# Patient Record
Sex: Male | Born: 1945 | Race: White | Hispanic: No | Marital: Married | Smoking: Never smoker
Health system: Southern US, Community
[De-identification: ages and names within clinical notes are randomized; demographics above are authoritative.]

## PROBLEM LIST (undated history)

## (undated) DIAGNOSIS — IMO0001 Reserved for inherently not codable concepts without codable children: Secondary | ICD-10-CM

## (undated) DIAGNOSIS — M545 Low back pain, unspecified: Secondary | ICD-10-CM

## (undated) DIAGNOSIS — T8859XA Other complications of anesthesia, initial encounter: Secondary | ICD-10-CM

## (undated) DIAGNOSIS — E669 Obesity, unspecified: Secondary | ICD-10-CM

## (undated) DIAGNOSIS — M1990 Unspecified osteoarthritis, unspecified site: Secondary | ICD-10-CM

## (undated) DIAGNOSIS — T4145XA Adverse effect of unspecified anesthetic, initial encounter: Secondary | ICD-10-CM

## (undated) HISTORY — PX: ROTATOR CUFF REPAIR: SHX139

## (undated) HISTORY — DX: Low back pain, unspecified: M54.50

## (undated) HISTORY — DX: Obesity, unspecified: E66.9

## (undated) HISTORY — DX: Low back pain: M54.5

## (undated) HISTORY — PX: HERNIA REPAIR: SHX51

---

## 2003-08-28 ENCOUNTER — Inpatient Hospital Stay (HOSPITAL_COMMUNITY): Admission: RE | Admit: 2003-08-28 | Discharge: 2003-08-30 | Payer: Self-pay | Admitting: General Surgery

## 2003-08-28 ENCOUNTER — Encounter (INDEPENDENT_AMBULATORY_CARE_PROVIDER_SITE_OTHER): Payer: Self-pay | Admitting: Specialist

## 2004-04-13 ENCOUNTER — Encounter: Admission: RE | Admit: 2004-04-13 | Discharge: 2004-04-13 | Payer: Self-pay | Admitting: General Surgery

## 2007-11-05 ENCOUNTER — Emergency Department (HOSPITAL_COMMUNITY): Admission: EM | Admit: 2007-11-05 | Discharge: 2007-11-06 | Payer: Self-pay | Admitting: Emergency Medicine

## 2008-02-01 ENCOUNTER — Encounter: Admission: RE | Admit: 2008-02-01 | Discharge: 2008-02-01 | Payer: Self-pay | Admitting: General Surgery

## 2008-04-28 ENCOUNTER — Encounter (INDEPENDENT_AMBULATORY_CARE_PROVIDER_SITE_OTHER): Payer: Self-pay | Admitting: General Surgery

## 2008-04-28 ENCOUNTER — Ambulatory Visit (HOSPITAL_COMMUNITY): Admission: RE | Admit: 2008-04-28 | Discharge: 2008-04-29 | Payer: Self-pay | Admitting: General Surgery

## 2008-06-13 ENCOUNTER — Encounter: Admission: RE | Admit: 2008-06-13 | Discharge: 2008-06-13 | Payer: Self-pay | Admitting: General Surgery

## 2008-06-25 ENCOUNTER — Ambulatory Visit (HOSPITAL_BASED_OUTPATIENT_CLINIC_OR_DEPARTMENT_OTHER): Admission: RE | Admit: 2008-06-25 | Discharge: 2008-06-26 | Payer: Self-pay | Admitting: General Surgery

## 2008-12-10 ENCOUNTER — Encounter: Admission: RE | Admit: 2008-12-10 | Discharge: 2008-12-10 | Payer: Self-pay | Admitting: Family Medicine

## 2009-10-07 IMAGING — CT CT ABDOMEN W/ CM
2 of 5 series · 16 of 46 positions shown, 18 images · IV contrast ([ID] OMNI 300)
Comparison: 02/01/2008

CT ABDOMEN

CLINICAL DATA: Hematoma.  Status post hernia repair.

CT ABDOMEN AND PELVIS WITH CONTRAST
TECHNIQUE: Multidetector CT imaging of the abdomen and pelvis was
performed using the standard protocol following bolus
administration of intravenous contrast.
Contrast: 125 ml of omni 300

[Series 3: routine abdomen · axial · 0.88mm/px · z∈[-425,-40]mm · 13 of 89 slices shown, 15 images]
[im 6/89  soft-tissue]
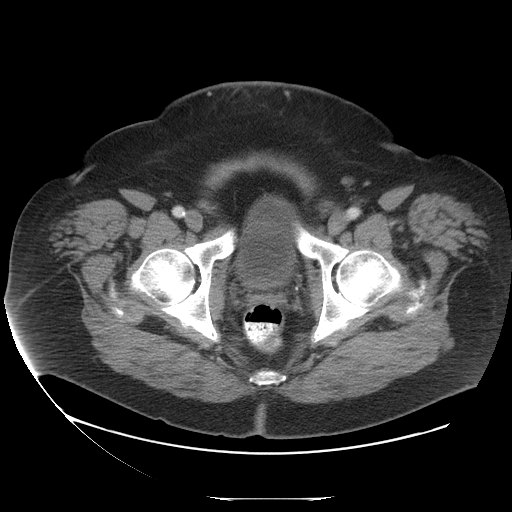
[im 6/89  bone]
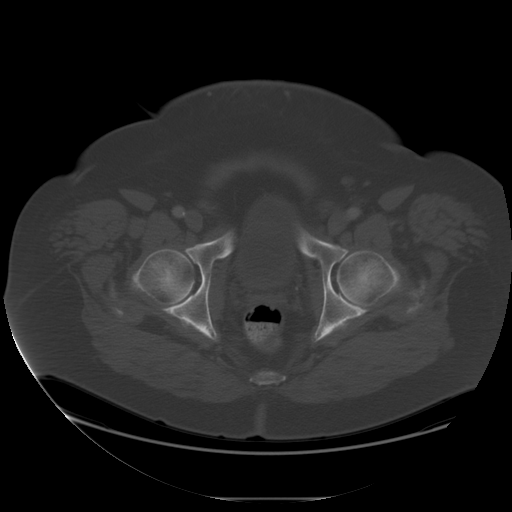
[im 11/89  soft-tissue]
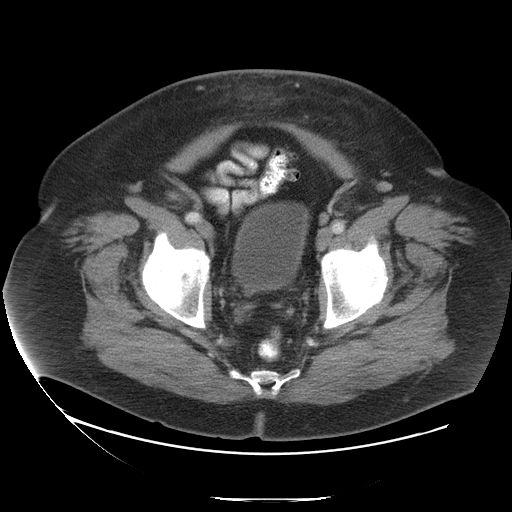
[im 21/89  soft-tissue]
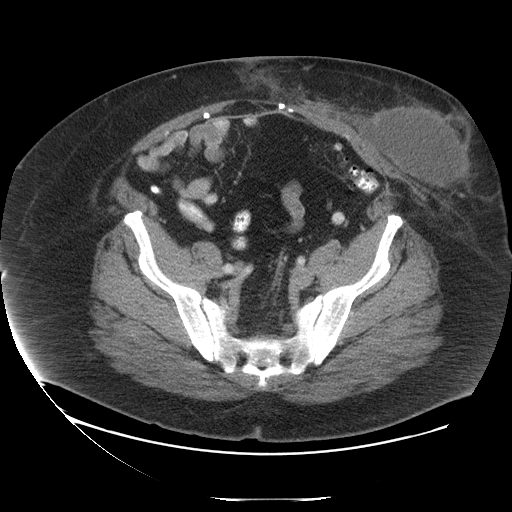
[im 26/89  soft-tissue]
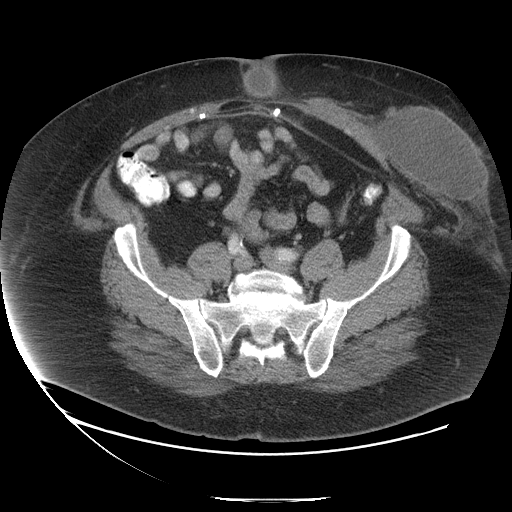
[im 32/89  soft-tissue]
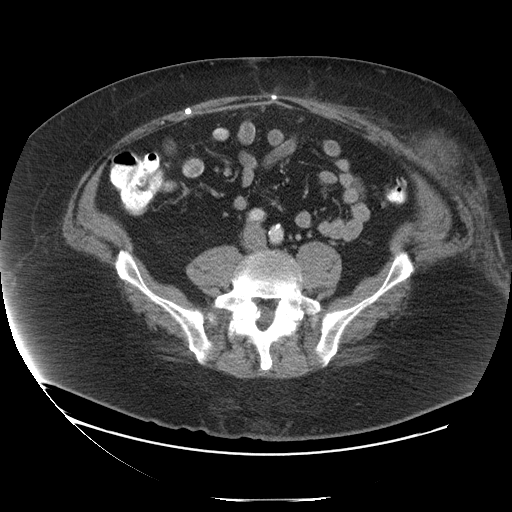
[im 37/89  soft-tissue]
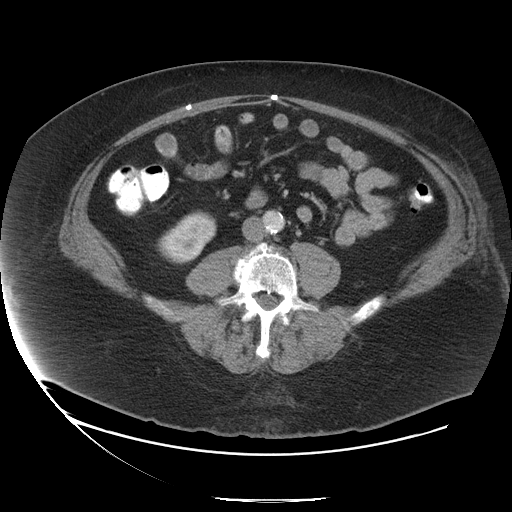
[im 47/89  soft-tissue]
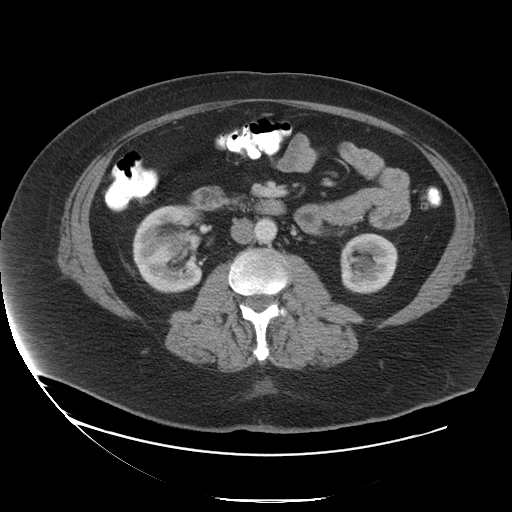
[im 52/89  soft-tissue]
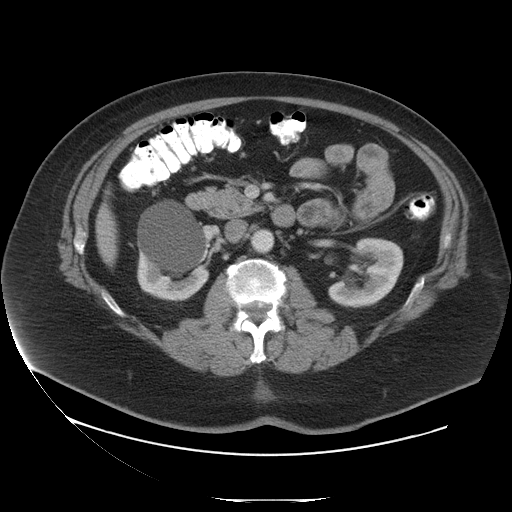
[im 57/89  soft-tissue]
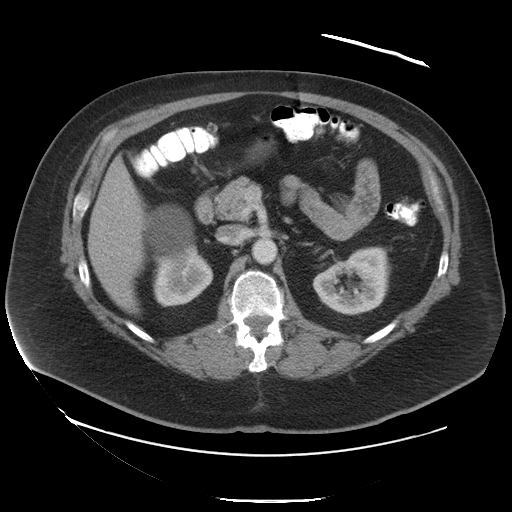
[im 57/89  bone]
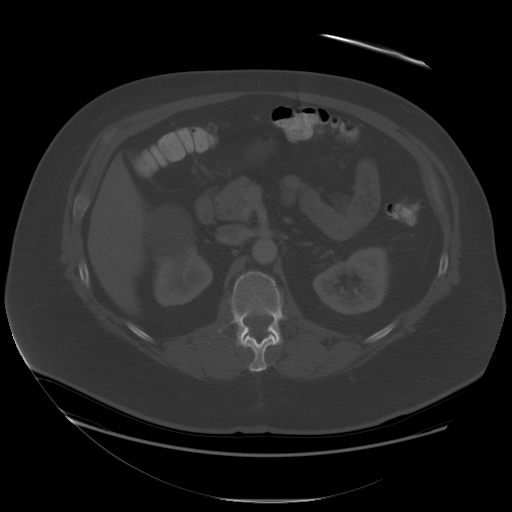
[im 63/89  soft-tissue]
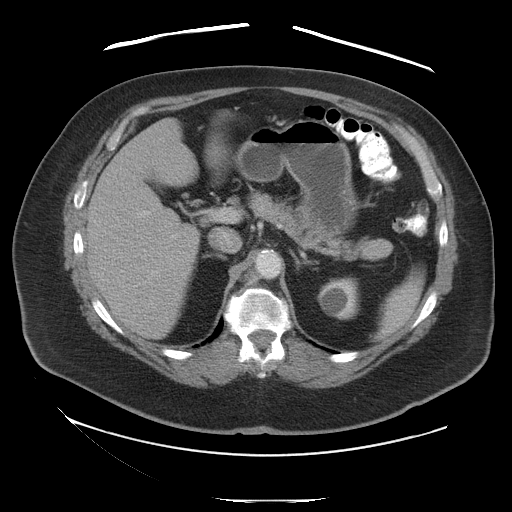
[im 68/89  soft-tissue]
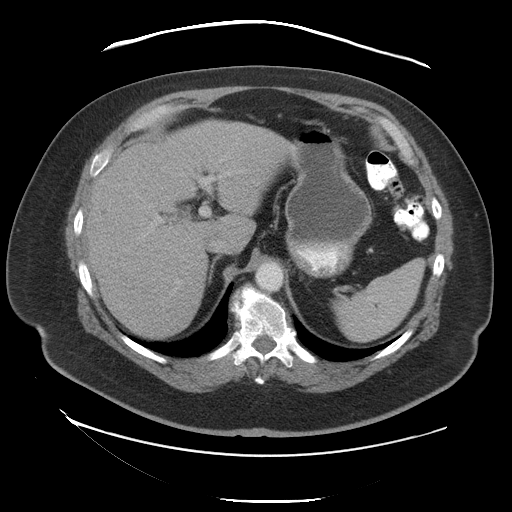
[im 78/89  soft-tissue]
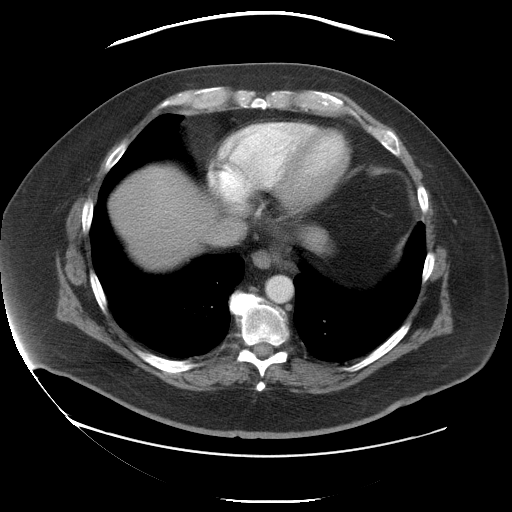
[im 83/89  soft-tissue]
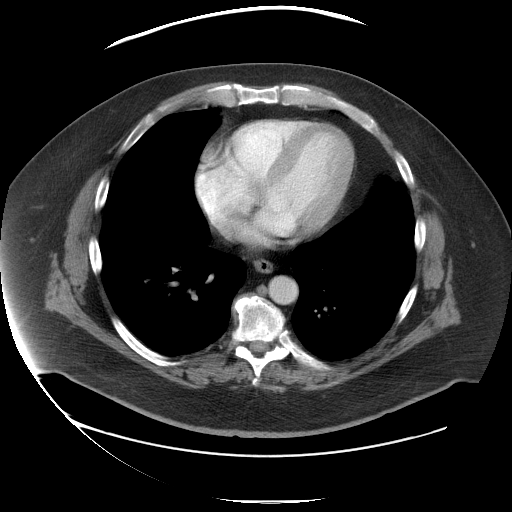

[Series 602: sagittal body · sagittal · 0.95mm/px · 3 of 181 slices shown]
[im 61/181  soft-tissue]
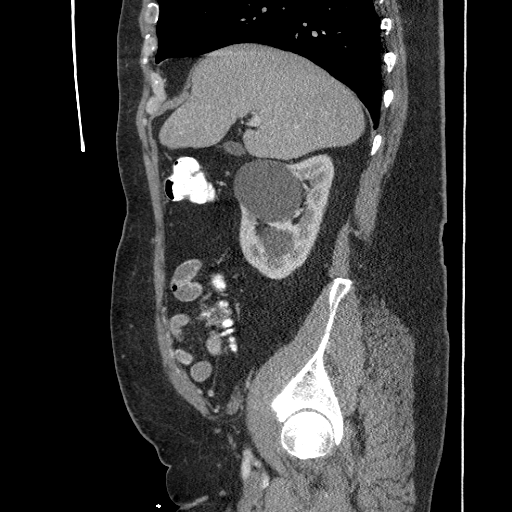
[im 81/181  soft-tissue]
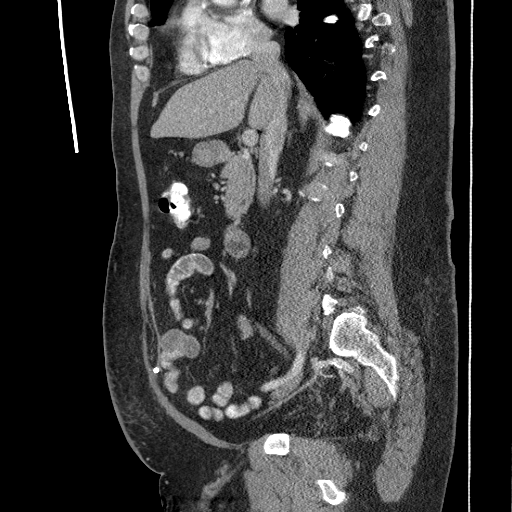
[im 101/181  soft-tissue]
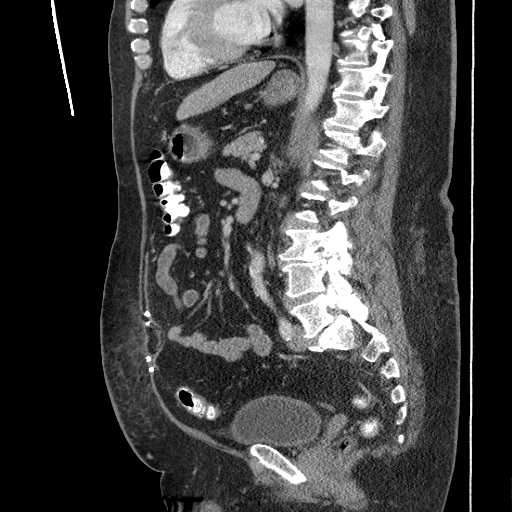

[16 of 46 positions shown; findings below may reference images not displayed]

FINDINGS: Stable 6 mm nodule in the right lung base, image #3

The spleen is normal.

The adrenal glands are normal.

Pancreas there is normal.

The liver parenchyma is normal in attenuation and morphology. There
are bilateral renal cysts which are unchanged from previous exam.

There are no enlarged retroperitoneal or small bowel mesenteric
lymph nodes.

The bowel loops of the upper abdomen are normal in their course and
caliber.

There is no free fluid.

Review of the visualized osseous structures shows lumbar
degenerative disc disease.

There are bilateral pars defects at the L5 level.

A first degree anterolisthesis of L5 on S1 is noted.  A first
degree anterolisthesis of L3 on L4 is also present.
IMPRESSION: 1.  There is no acute upper abdominal CT findings.
2.  Advanced lumbar spondylosis.
3.  Stable renal cysts.
4.  No change in right lung base pulmonary nodule.

CT PELVIS
FINDINGS: Fluid collection within the left lateral abdominal wall
measures 12.6 x 5.9 cm, image 67.  On the previous exam this
measured 4.3 x 3.7 cm.

A second fluid collection is identified in the region of umbilicus.
This measures 3 cm, image 62.

Hernia mesh is identified along the undersurface of the ventral
pelvic wall.

There are diverticular changes affecting the sigmoid colon.

There are no enlarged pelvic or inguinal lymph nodes.

No free fluid.

The urinary bladder is normal.

Review of visualized osseous structures is negative
IMPRESSION: 1.  Left ventral pelvic wall fluid collection has increased in size
from previous exam.  This may represent a chronic hematoma, abscess
or seroma.
2.  Periumbilical fluid collection is new from the previous exam.
Differential considerations include seroma, chronic hematoma or
abscess.

## 2010-04-05 IMAGING — US US ABDOMEN COMPLETE
1 series · 14 of 25 positions shown · non-contrast
Comparison: 06/13/2008

CLINICAL DATA: Abnormal liver function test

COMPLETE ABDOMINAL ULTRASOUND

[Series 1: us abdomen complete · 0.32mm/px · 14 of 78 slices shown]
[im 1/78]
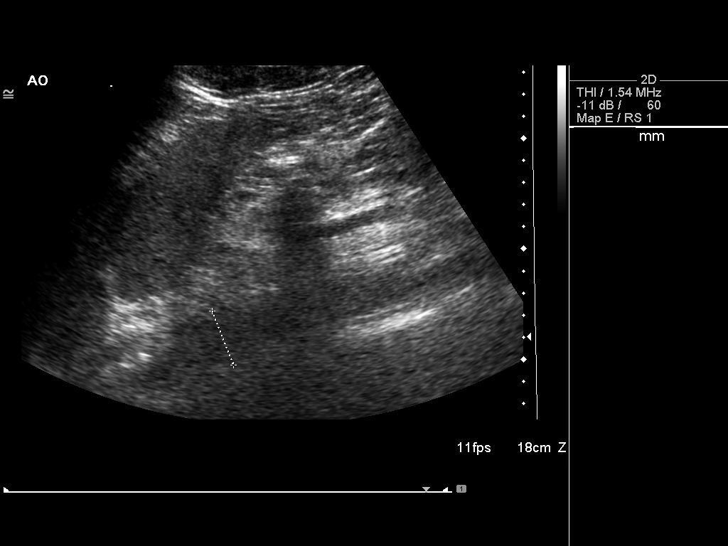
[im 7/78]
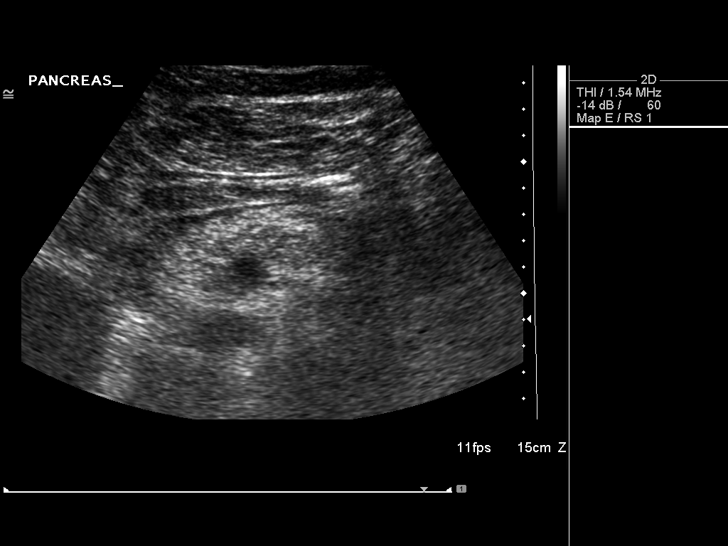
[im 13/78]
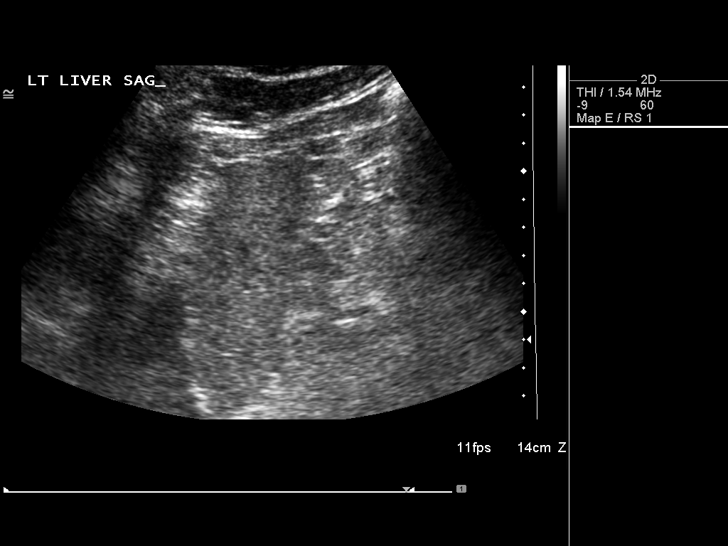
[im 20/78]
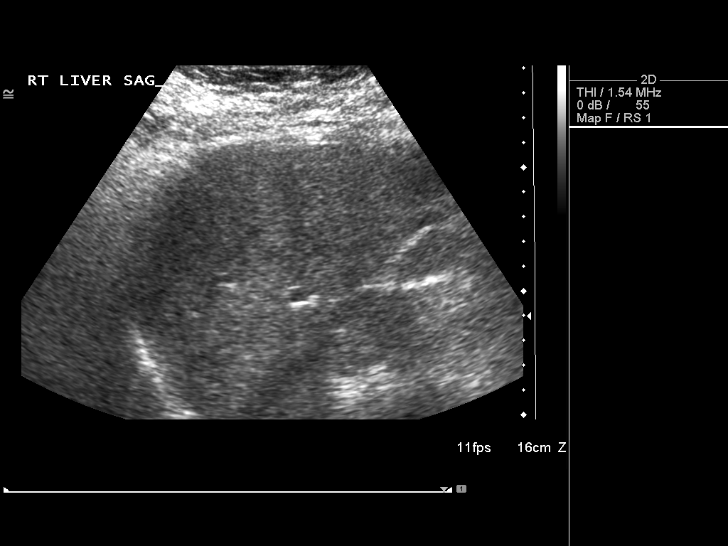
[im 26/78]
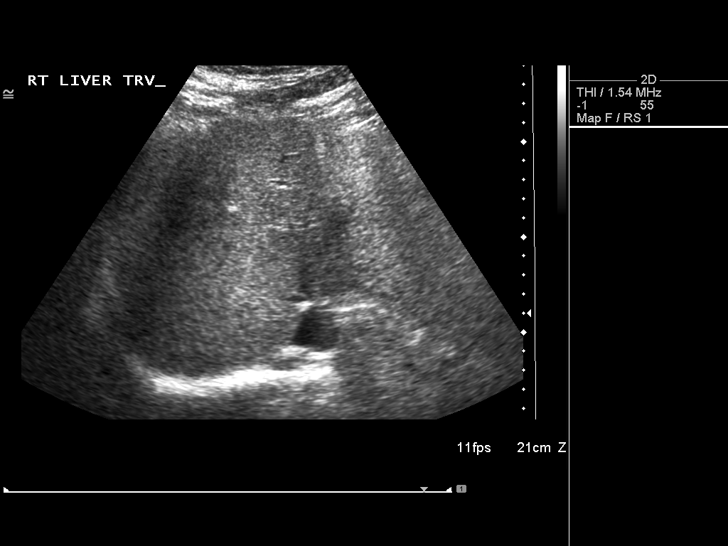
[im 29/78]
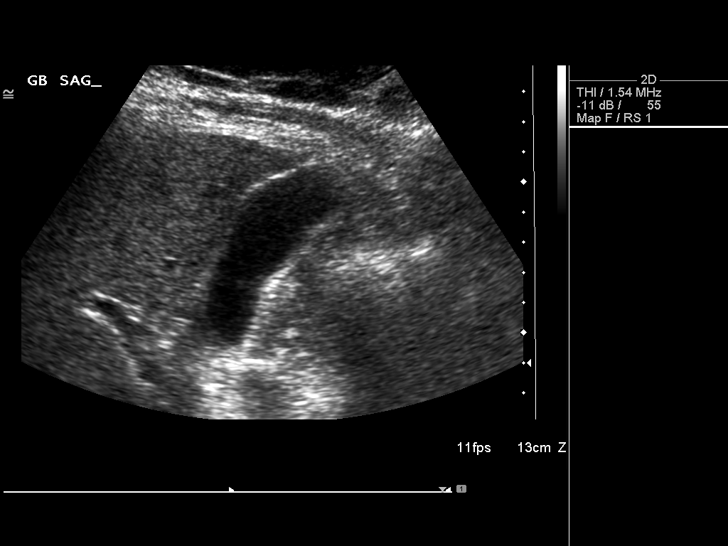
[im 36/78]
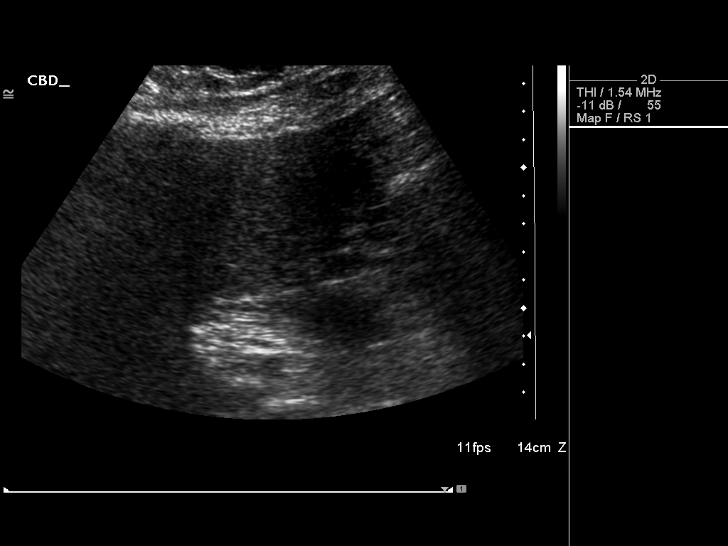
[im 42/78]
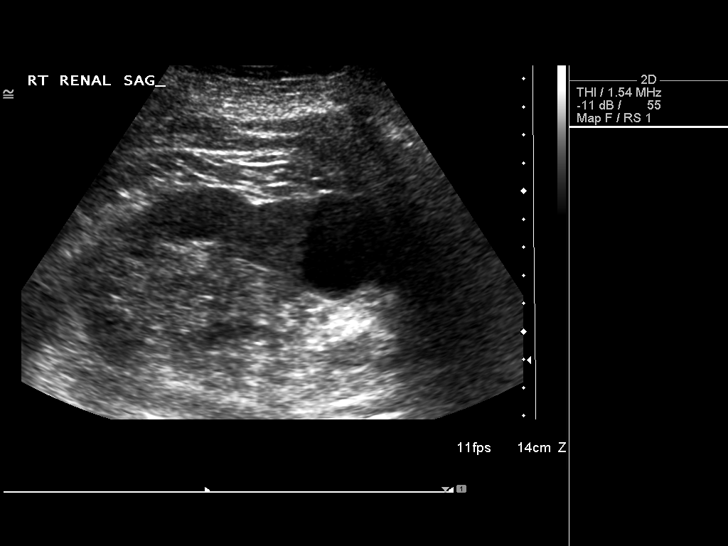
[im 49/78]
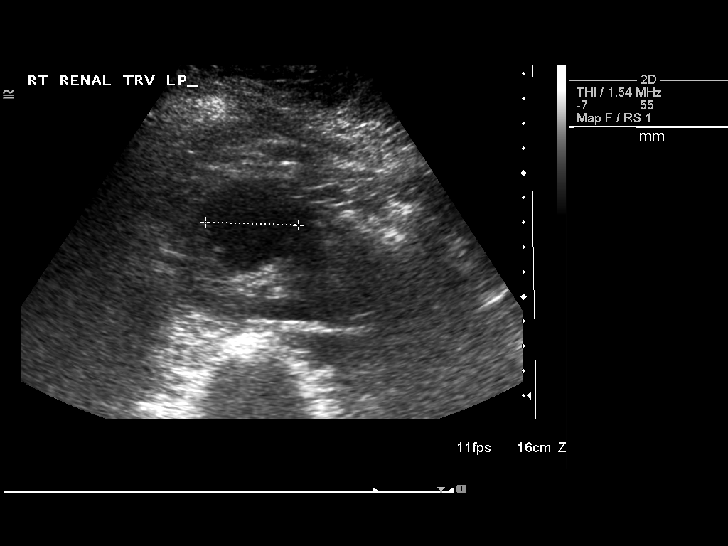
[im 52/78]
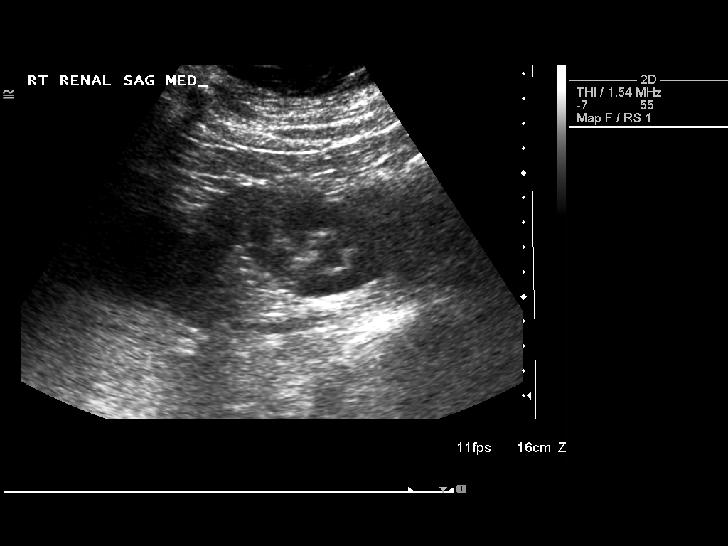
[im 58/78]
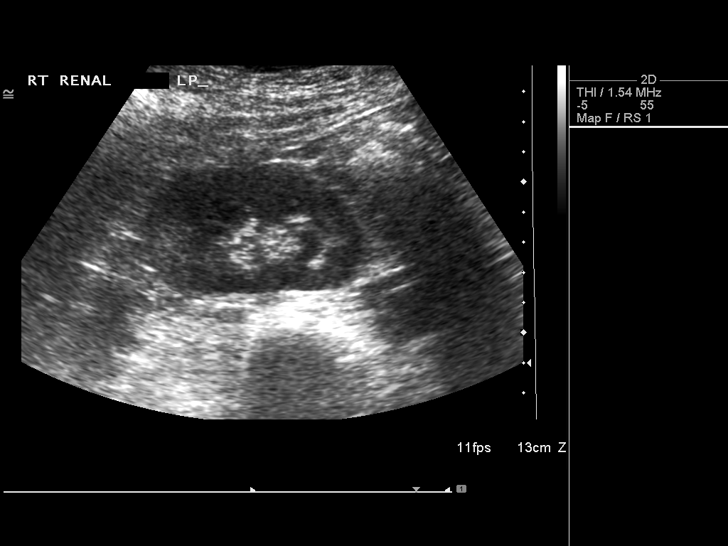
[im 65/78]
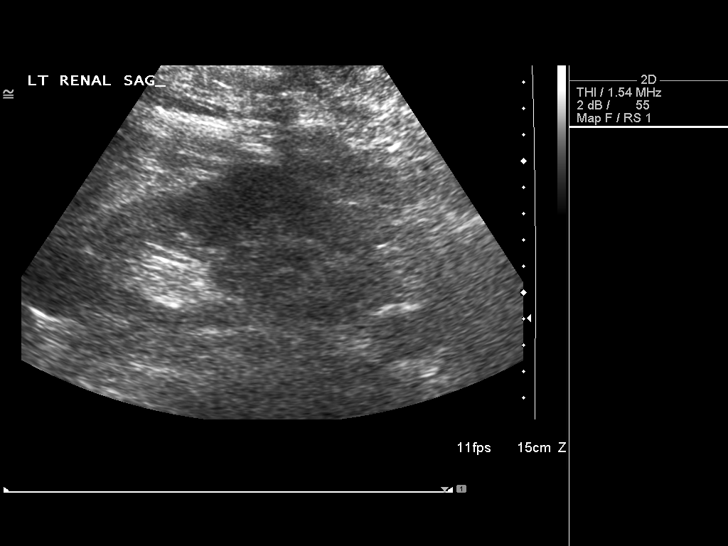
[im 71/78]
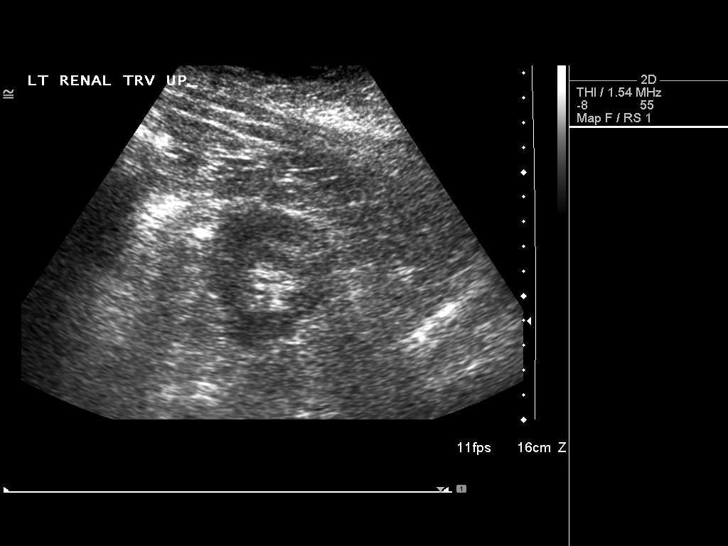
[im 78/78]
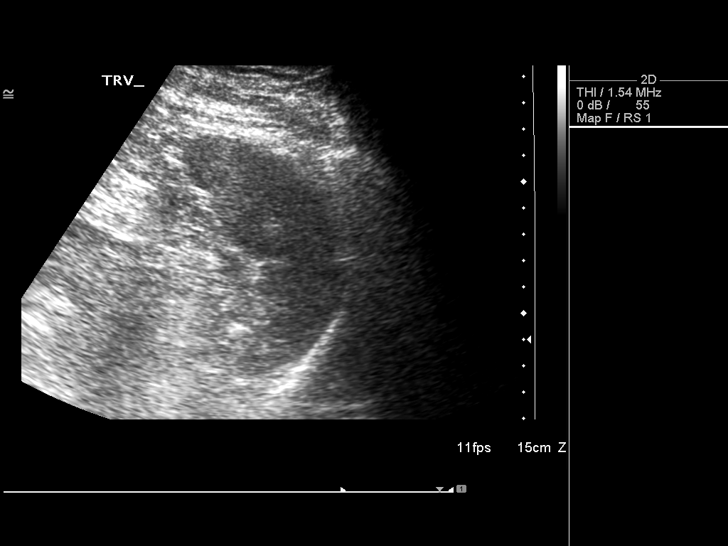

[14 of 25 positions shown; findings below may reference images not displayed]

FINDINGS: Gallbladder:  No gallstones, wall thickening, or pericholecystic
fluid.

Common bile duct:  Normal in caliber.

Liver:  Normal echogenicity.  No focal mass.

IVC:  Patent.

Pancreas:  Obscured by overlying bowel gas.  No obvious mass.

Spleen:  Unremarkable.

Right Kidney:  Two simple cysts are stable.  13.5 cm in length.  No
hydronephrosis or solid mass.

Left Kidney:  One simple cyst in the upper pole is stable.  Normal
echogenicity.  No hydronephrosis or solid mass.

Abdominal aorta:  Maximal caliber is 2.6 cm.
IMPRESSION: Stable simple cyst in the kidneys.  Limited visualization of the
pancreas.  No acute intra-abdominal pathology.

## 2010-08-31 LAB — URINE MICROSCOPIC-ADD ON

## 2010-08-31 LAB — BASIC METABOLIC PANEL
BUN: 13 mg/dL (ref 6–23)
CO2: 27 mEq/L (ref 19–32)
Calcium: 9.1 mg/dL (ref 8.4–10.5)
Chloride: 102 mEq/L (ref 96–112)
Creatinine, Ser: 0.82 mg/dL (ref 0.4–1.5)
GFR calc Af Amer: >60 mL/min (ref >60)
GFR calc non Af Amer: >60 mL/min (ref >60)
Glucose, Bld: 130 mg/dL — ABNORMAL HIGH (ref 70–99)
Potassium: 4.1 mEq/L (ref 3.5–5.1)
Sodium: 135 mEq/L (ref 135–145)

## 2010-08-31 LAB — URINALYSIS, ROUTINE W REFLEX MICROSCOPIC
Bilirubin Urine: NEGATIVE
Glucose, UA: NEGATIVE mg/dL
Hgb urine dipstick: NEGATIVE
Ketones, ur: NEGATIVE mg/dL
Nitrite: NEGATIVE
Protein, ur: NEGATIVE mg/dL
Specific Gravity, Urine: 1.017 (ref 1.005–1.030)
Urobilinogen, UA: 0.2 mg/dL (ref 0.0–1.0)
pH: 6 (ref 5.0–8.0)

## 2010-08-31 LAB — PROTIME-INR
INR: 1 (ref 0.00–1.49)
Prothrombin Time: 13 seconds (ref 11.6–15.2)

## 2010-08-31 LAB — CBC
HCT: 46 % (ref 39.0–52.0)
Hemoglobin: 15.8 g/dL (ref 13.0–17.0)
MCHC: 34.4 g/dL (ref 30.0–36.0)
MCV: 102.6 fL — ABNORMAL HIGH (ref 78.0–100.0)
Platelets: 264 10*3/uL (ref 150–400)
RBC: 4.48 MIL/uL (ref 4.22–5.81)
RDW: 12.7 % (ref 11.5–15.5)
WBC: 6 10*3/uL (ref 4.0–10.5)

## 2010-08-31 LAB — HIV RAPID SCREEN (BLD OR BODY FLD EXPOSURE)

## 2010-08-31 LAB — DIFFERENTIAL
Basophils Absolute: 0 10*3/uL (ref 0.0–0.1)
Basophils Relative: 0 % (ref 0–1)
Eosinophils Absolute: 0.1 10*3/uL (ref 0.0–0.7)
Eosinophils Relative: 2 % (ref 0–5)
Lymphocytes Relative: 35 % (ref 12–46)
Lymphs Abs: 2.1 10*3/uL (ref 0.7–4.0)
Monocytes Absolute: 0.5 10*3/uL (ref 0.1–1.0)
Monocytes Relative: 8 % (ref 3–12)
Neutro Abs: 3.3 10*3/uL (ref 1.7–7.7)
Neutrophils Relative %: 55 % (ref 43–77)

## 2010-08-31 LAB — ANAEROBIC CULTURE

## 2010-08-31 LAB — WOUND CULTURE
Culture: NO GROWTH
Gram Stain: NONE SEEN

## 2010-08-31 LAB — APTT: aPTT: 27 seconds (ref 24–37)

## 2010-08-31 LAB — HEPATITIS B SURFACE ANTIGEN: Hepatitis B Surface Ag: NEGATIVE

## 2010-08-31 LAB — HEPATITIS C ANTIBODY, REFLEX: HCV Ab: NEGATIVE

## 2010-09-28 NOTE — Op Note (Signed)
NAMEDIONICIO, SHELNUTT NO.:  [id number]   MEDICAL RECORD NO.:  [id number]          PATIENT TYPE:  OIB   LOCATION:  1533                         FACILITY:  [facility]   PHYSICIAN:  Angelia Mould. Derrell Lolling, M.D.DATE OF BIRTH:  06-Mar-1946   DATE OF PROCEDURE:  04/28/2008  DATE OF DISCHARGE:  04/29/2008                               OPERATIVE REPORT   PREOPERATIVE DIAGNOSES:  1. Ventral hernia.  2. 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   POSTOPERATIVE DIAGNOSES:  1. Ventral hernia.  2. 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   OPERATION PERFORMED:  1. Laparoscopic repair of ventral hernia, with 15 cm x 10 cm Parietex      composite mesh.  2. Excision of a 4.5 cm soft tissue mass of the abdominal wall, left      lower quadrant.   SURGEON:  Angelia Mould. Derrell Lolling, M.D.   OPERATIVE INDICATIONS:  This is a 65 year old [place] man who underwent  repair of a left spigelian hernia in 2005.  That surgery was uneventful.  He has noticed a lump in the incision.  This has been evaluated and is  thought to be a chronic hematoma.  The hernia repair is intact.  He did  not want to have that excised previously.   He recently returned to see me, having lost 40 pounds, and has a hernia  at his umbilicus.  He wanted to have that repaired as well as the mass  excised.   On exam, he has a moderately large hernia in the periumbilical region,  with a defect about 3 cm and a hernia sac about 4 cm in size.  In the  left lower quadrant underlying the left lower quadrant incision from his  hernia repair, there is a very firm, nontender palpable mass that is not  fixed to the fascia; and by CT is in the subcutaneous tissue.  He is  brought to the operating room for surgical management of both problems.   OPERATIVE TECHNIQUE:  Following the induction of general endotracheal  anesthesia, the patient's abdomen and genitalia and lower chest wall  were prepped  and draped in a sterile fashion.  The patient was  identified as the correct patient, correct procedures and correct site.  A time-out was held.  Intravenous antibiotics were given.  Marcaine 0.5%  with epinephrine was used as a local infiltration anesthetic.   We repaired the ventral hernia first.  A 5-mm OptiVu port was placed in  the left subcostal region, and that insertion was atraumatic.  Pneumoperitoneum was created.  A 5 mm camera was inserted.  We carefully  looked around; saw no evidence of any injury.  There was omentum  incarcerated in the ventral hernia but I was able to push this back into  the abdominal cavity; I did not have to take down any adhesions.  The  defect looked to be about 3 cm in diameter, maybe 4 cm at most.  The  liver and omentum otherwise looked normal.  In the left lower quadrant  there were a  few adhesions, presumably related to the previous surgery.  However there was no evidence of any hernia down there.   I placed a 5-mm trocar in the left lower quadrant and a 11-mm trocar in  the left midabdomen.   I examined the hernia defect.  Using a spinal needle, I measured and  marked the circumference of the defect, and it actually turned out to be  about 4 cm vertically x about 3.5 cm transversely.  To cover this well I  brought a 10 cm x 15 cm rectangular piece of Parietex composite sealing  mesh into the operative field.  This was placed on the abdominal wall  and a template was marked for 8 equidistant suture fixation sites.  I  then used 0 Novofil and placed 8 interrupted sutures in the mesh at the  edge of the mesh, and tied 3 or 4 knots and leaving the ends long.  I  was very careful to place this on the rough side, that would be up  toward the abdominal wall -- leaving the smooth side toward the viscera.   I then moistened the mesh, rolled it up and inserted it through the 11-  mm trocar.  Under laparoscopic guidance we then positioned this.  I  made  8 small puncture wounds in the abdominal wall, through the previously  marked template.  I then drew the sutures up through the puncture  wounds, being careful to take about a  1-cm bite of fascia.  After all 8 sutures were placed, we lifted the  mesh up and we had excellent coverage of the defect.  No redundancy.  Placement of the sutures.  All 8 sutures were then tied and cut.  We  then further secured the mesh to the abdominal wall with about 40  5-mm  titanium screw tacks.  I placed these around the perimeter of the mesh,  no more widely than 1 cm apart.  I then fused centrally.  This was  observed and looked fine.  A survey of the abdomen and pelvis saw no  other any problems or injury.  There was no bleeding anywhere.  I  released the pneumoperitoneum.  The trocars were removed.  All of these  trocar sites were closed with skin staples.   I then turned my attention to the mass in the left lower quadrant.  I  made about a 8 cm incision overlying the palpable mass.  Dissection was  carried down through the subcutaneous tissue.  I then dissected around  the mass and found that it was completely free of the fascia.  It felt  firm and looked like a chronic hematoma.  I completely excised this and  sent it to the Lab for routine histology.  I examined the abdominal wall  and the hernia repair was solid and secure.  The wound was irrigated  with saline.  Hemostasis was excellent and treated with electrocautery.  Subcutaneous tissue was closed with interrupted sutures of 2-0 Vicryl.  The skin was closed with skin staples.  Clean bandages were placed and  the patient taken to the recovery room in stable condition.   ESTIMATED BLOOD LOSS:  Was about 20 mL.   COMPLICATIONS:  None sponge, needle and counts were correct.      Angelia Mould. Derrell Lolling, M.D.  Electronically Signed     HMI/MEDQ  D:  04/28/2008  T:  04/29/2008  Job:  [id number]   cc:   C. Duane Lope, M.D.  Fax: [phone]

## 2010-09-28 NOTE — Op Note (Signed)
NAMELAVANCE, BEAZER                ACCOUNT NO.:  [id number]   MEDICAL RECORD NO.:  [id number]          PATIENT TYPE:  AMB   LOCATION:  DSC                          FACILITY:  [facility]   PHYSICIAN:  Angelia Mould. Derrell Lolling, M.D.DATE OF BIRTH:  1946/01/20   DATE OF PROCEDURE:  06/25/2008  DATE OF DISCHARGE:                               OPERATIVE REPORT   PREOPERATIVE DIAGNOSIS:  Recurrent abdominal wall hematoma.   POSTOPERATIVE DIAGNOSIS:  Recurrent abdominal wall hematoma.   OPERATION PERFORMED:  Exploration of abdominal wound, evacuation of  hematoma.   SURGEON:  Angelia Mould. Derrell Lolling, MD   OPERATIVE INDICATIONS:  This is a 65 year old [place] man who underwent  repair of an incarcerated left spigelian hernia in 2005 with uneventful  recovery.  He returned 18 months later because of a lump in his incision  and on exam and by CT scan this showed a hematoma or seroma, but he  declined to have this evacuated or removed as it was asymptomatic.  He  returned to see me in September 2009 with symptomatic umbilical hernia  and wanted to have the umbilical hernia repaired and the hematoma  evacuated.  He was taken to the operating room on April 28, 2008 and  underwent laparoscopic repair of the periumbilical ventral hernia with  mesh and I evacuated the chronic hematoma from the left lower quadrant  incision.  He did well for a week or two but then developed swelling and  pain and ecchymoses and on followup in the office it was apparent that  he had some acute bleeding and a hematoma in the space where we had  evacuated the hematoma previously.  The ecchymoses have resolved.  He  wants to have this hematoma resolved and I think that it would be in his  best interest to do that.  He is brought to the operating room  electively.  It should be noted that there is no signs or evidence of  infection at this time.   OPERATIVE TECHNIQUE:  Following the induction of general endotracheal  anesthesia, the  patient's abdomen, groin, thigh and genitalia were  prepped and draped in a sterile fashion.  The patient was identified as  the correct patient, correct procedure, and correct site.  Intravenous  antibiotics were given.  I made an oblique incision in the left lower  quadrant of the abdominal wall, through the previous scar.  Dissection  was carried down through the subcutaneous tissue.  I carefully entered  the hematoma cavity and found that it was soft organizing hematoma.  There was no odor.  Aerobic and anaerobic cultures were taken.  I opened  the wound up for a distance of about 10 cm or so and evacuated the  hematoma.  I then used a pulsatile irrigator to lavage and irrigate the  entire wound and get all the debris out.  The walls of the wound looked  clean and without any sign of infection.  The spigelian hernia repair  was intact.  There was no active bleeding anywhere.  A 19-French Blake  drain was placed in the  wound and brought out through a separate stab  incision superolateral to the wound.  The drain was sutured in place  with a nylon suture and connected to a suction bulb.  The wound was  inspected one more time and there was no bleeding.  The  deep subcutaneous tissue was closed with interrupted sutures of 3-0  Vicryl, the skin closed with skin staples.  Clean bandages were placed  and the patient taken to recovery room in stable condition.  Estimated  blood loss was about 10 mL of acute blood.  The chronic hematoma itself  was probably 500 mL or so.      Angelia Mould. Derrell Lolling, M.D.  Electronically Signed     HMI/MEDQ  D:  06/25/2008  T:  06/26/2008  Job:  [id number]

## 2010-10-01 NOTE — Op Note (Signed)
Austin Estrada, Austin Estrada NO.:  [id number]   MEDICAL RECORD NO.:  [id number]                   PATIENT TYPE:  AMB   LOCATION:  DAY                                  FACILITY:  [facility]   PHYSICIAN:  Madlyn Frankel. Charlann Boxer, M.D.               DATE OF BIRTH:  April 10, 1946   DATE OF PROCEDURE:  08/28/2003  DATE OF DISCHARGE:                                 OPERATIVE REPORT   PREOPERATIVE DIAGNOSES:  1. Left shoulder biceps tendonitis.  2. Subacromial impingement.  3. Rotator cuff tear.   </POSTOPERATIVE DIAGNOSES/FINDINGS>  1. Degenerative labrum in the superior-anterior aspect at the biceps     attachment with significant biceps tendonitis and partial tearing of at     least a third of the tendon.  2. Subacromial impingement.  3. Supraspinatus rotator cuff tear.   PROCEDURE:  1. Left shoulder diagnostic and operative arthroscopy.  2. Labral debridement and biceps tenotomy.  3. Open subacromial decompression.  4. Open rotator cuff repair with a 6.5 mm Bioscrew with two sets of #2     Fibrewire medially and lateral repair of three bone tunnels with     Fibrewire.   SURGEON:  Durene Romans, MD   ASSISTANT:  Clarene Reamer, P.A.-C.   ANESTHESIA:  Regional plus general endotracheal anesthesia.   BLOOD LOSS:  100.   COMPLICATIONS:  None.   DRAINS:  None.   INDICATION FOR PROCEDURE:  Austin Estrada is a pleasant 65 year old, right-hand-  dominant male, who presented to the clinic with left shoulder pain and  dysfunction.  He was initially seen in [facility] and  referred for evaluation.  He states that he was cleaning his garage when he  was reaching onto a top shelf and felt a pop in his left shoulder.  He had  the immediate onset of pain without radicular symptoms.  He had had an MRI  that was performed by primary care physician which revealed rotator cuff  tear, and he was sent for evaluation.  Of note, the patient also was noted  to have on  preoperative examination, what was felt to be a lipoma on CT  scan.  This patient was seen by Dr. Derrell Lolling for surgical consideration of  this.  This rotator cuff repair was set up in conjunction with the lipoma  excision.  After reviewing risks and benefits of diagnostic and operative  arthroscopy as well as open rotator cuff repair, the patient consents for  the above procedure.  We reviewed the postoperative course and the lengthy  course expected.   PROCEDURE IN DETAIL:  Following the abdominal wall procedure, orthopedics  was called to the room.  The patient was positioned into the beach chair  position.  The left upper extremity was then prepped and draped in a sterile  fashion.  Note that the patient had already had preoperative antibiotics.  Landmarks were  identified.  Initial procedure began with diagnostic and  operative arthroscopy which revealed above findings.  The superior and  anterior labrum was significantly degenerative but appeared to be stable on  the superior aspect.  There was also significant degeneration and partial  tearing of the biceps tendon.  An anterior portal was created, and  debridement of the labrum was carried out.  A __________ incision was made  for biceps tenotomy secondary to this degenerative nature which was also  noted in his MRI.  Following this, a diagnostic evaluation of the rotator  cuff was carried out which revealed the anterior supraspinatus tear with  remaining being intact along the barrier of the humerus.  Following this  diagnostic evaluation, attention was directed to open repair.  The two  arthroscopic portals were reapproximated using a 3-0 nylon.  A lateral-based  incision was made.  Sharp dissection was carried down and skin flaps  created.  The deltoid muscle was then incised along one of its raphe over  the anterolateral aspect of the acromion.  Deltoid fascia was exposed off of  the acromion subperiosteally.  Following this, the  subacromial impingement  and acromial morphology was noted and identified to be tight.  A TPS  oscillating saw was used to perform an anterior open subacromial  decompression.  At this point, following decompression, the access to the  rotator cuff muscle tendon was a lot more accessible.  Subacromial bursa was  excised, allowing for further exposure.  A tear was identified, and it was  noted arthroscopically.  The tendon was noted to have some degenerative  change along the leading edge, and this was debrided out using a knife 15  blade.  At this point, attention was directed to preparation of the tear for  repair.  The greater tuberosity insertion was debrided of soft tissue, no  bone trough created.  Identification of the location for the bone screw was  identified and prepared and the 6.5 bone screw placed in a 45-degree angle.  Note that two retaining sutures were placed to allow for reduction of the  cuff tear.  With the cuff reduced, these sutures were passed medially.  Following this, two bone tunnels were created, and two modified Mason-Allen  sutures with #2 Fibrewire were passed.  With the tendon reduced to its  anatomic position, the medial sutures of the suture anchor were sutured  down.  Following this, the lateral bone tunnels were sutured down.  The  patient was noted to have a small gap anterior and a redundancy of the  tendon posterior to this repair.  The redundancy was excised and  reapproximated to itself.  The opening just anterior to the repair was  reapproximated using a #1 Vicryl through bone.  Following this, the wound  was copiously irrigated with normal saline solution.  The deltoid fascia was  reapproximated to itself over the top of the lateral acromion and then  reapproximated to itself laterally.  Subcutaneous layer was reapproximated  using 2-0 Vicryl and 4-0 chromic used on the skin.  The patient tolerated the procedure without complications.  The wound was  dressed sterilely with  Steri-Strips, dressing, sponges, and tape.                                               Madlyn Frankel Charlann Boxer, M.D.  MDO/MEDQ  D:  08/28/2003  T:  08/28/2003  Job:  [id number]

## 2010-10-01 NOTE — Discharge Summary (Signed)
NAMEEDWAR, COE NO.:  [id number]   MEDICAL RECORD NO.:  [id number]                   PATIENT TYPE:  INP   LOCATION:  0445                                 FACILITY:  [facility]   PHYSICIAN:  Angelia Mould. Derrell Lolling, M.D.             DATE OF BIRTH:  August 04, 1945   DATE OF ADMISSION:  08/28/2003  DATE OF DISCHARGE:  08/30/2003                                 DISCHARGE SUMMARY   FINAL DIAGNOSES:  1. Incarcerated left spigelian hernia.  2. Left shoulder biceps tendinitis with partial tear and subacromial     impingement and rotator cuff tear.  3. Obesity.   OPERATIONS PERFORMED:  1. Repair of incarcerated left spigelian hernia with polypropylene mesh.  2. Left shoulder arthroscopy, labral debridement, and biceps tenotomy, open     subacromial decompression, open rotator cuff repair, with 6.5 mm     BioScrew, and with 2 sets of #2 fiber wire medially, and lateral repair     of 3 bone tunnels with fiber wire.  Date of surgery August 28, 2003.   HISTORY:  This is a 65 year old [place] man who has had a lump in his left  lower quadrant of his abdominal wall for some time.  This has been  enlarging.  It is not clear whether this was every reducible.  He had a CT  scan in [place], [place], which showed a giant lipoma in the left  abdominal wall.  I took a look at this, and indeed it looks like all adipose  tissue.  This is completely lateral to the rectus muscle that is between the  internal and external oblique muscles.  He also has a left rotator cuff  tear, and surgery is planned by Dr. Durene Romans.  On examination of the  abdomen, he is obese, weighing 272 pounds, and being 5'7 tall.  In his left  lower quadrant, there is a 30-40 cm elliptical-shaped soft tissue mass,  which is really not tender and not reducible.  This is above the inguinal  ligament.  Surgery was planned, and the patient was brought to the operating  room electively.   HOSPITAL COURSE:   The patient was admitted to the hospital and taken to the  operating room on the day of admission.  The first procedure was performed  by me.  I explored the abdominal wall mass through an oblique incision, and  I found that he had a large incarcerated left spigelian hernia containing  the sigmoid colon.  I debrided the hernia sac, inspected the sigmoid colon,  reduced the colon, closed the defect primarily, and then reinforced the  repair with a large sheet of polypropylene mesh, and closed the wound.  Dr.  Durene Romans then came to the operating room and performed the left shoulder  surgery as described above, and that went well.   Postoperatively, the patient did well.  He progressed  slowly but steadily in  his diet and activities.  A Foley catheter was removed on postoperative day  #1, and his diet and activities were advanced.  On August 30, 2003, he was  tolerating a regular diet and ambulatory.  Jackson-Pratt drain was draining  thin fluid, but drained 65 cc, and so was left in place.  He was discharged  on August 30, 2003.   DISCHARGE MEDICATIONS:  1. He was given a prescription for Vicodin for pain.  2. He was told to continue his usual medications.   FOLLOW UP:  1. He was to follow up with Dr. Derrell Lolling in 3-5 days to check the wound and     the drain.  2. Dr. Charlann Boxer plans to see the patient in 2 weeks.   DISCHARGE INSTRUCTIONS:  He was given instructions in diet and activities.                                               Angelia Mould. Derrell Lolling, M.D.    HMI/MEDQ  D:  09/08/2003  T:  09/08/2003  Job:  [id number]   cc:   Beverely Low, M.D.  [place], [facility] D. Charlann Boxer, M.D.  [facility]  [address]  [address]  [place]  [place] [address]  Fax: [phone]

## 2010-10-01 NOTE — Op Note (Signed)
Austin Estrada, Austin Estrada NO.:  [id number]   MEDICAL RECORD NO.:  [id number]                   PATIENT TYPE:  AMB   LOCATION:  DAY                                  FACILITY:  [facility]   PHYSICIAN:  Angelia Mould. Derrell Lolling, M.D.             DATE OF BIRTH:  1946-04-15   DATE OF PROCEDURE:  08/28/2003  DATE OF DISCHARGE:                                 OPERATIVE REPORT   PREOPERATIVE DIAGNOSIS:  Abdominal wall mass.   POSTOPERATIVE DIAGNOSIS:  Incarcerated left spigelian hernia.   OPERATION PERFORMED:  Repair of incarcerated left spigelian hernia with  mesh.   SURGEON:  Angelia Mould. Derrell Lolling, M.D.   OPERATIVE INDICATIONS:  This is a 65 year old [place] man who has had a knot  in the left lower quadrant of his abdominal wall for some time.  This was  originally thought to be a hernia and was enlarging, not very painful, it  bothers him mostly because of its prominence.  He really states that he does  not have any GI symptoms.  He had a CT scan of the abdomen and pelvis in  [place], [place] which was read out as showing a giant lipoma of the  abdominal wall.  This was felt to be deep to the external oblique muscle.  The only thing seen within this was fat density, no GI contents were seen,  there was no bile obstruction or other lesions noted.  On exam he does have  a moderately large palpable mass about the size of a grapefruit in the left  lower quadrant of the abdominal wall that is not tender.  He also has a left  rotator cuff injury and needs to have that repaired.  Dr. Orpah Clinton and I  have discussed his care and have decided to perform exploration of the  abdominal wall and the rotator cuff at the same setting.   OPERATIVE TECHNIQUE:  Following the induction of general endotracheal  anesthesia, a Foley catheter was inserted.  Intravenous antibiotics were  given.  The abdomen was prepped and draped in a sterile fashion.  An oblique  incision was made in the  left lower quadrant.  Dissection was carried down  through the subcutaneous tissue.  The external oblique was visualized and  incised obliquely, entering the space between the internal oblique and the  external oblique.  I encountered a large hernia sac.  I dissected the  external oblique away from the internal oblique generously in all  directions, all the way medially to the rectus sheath.  I then opened the  hernia sac and found a large loop of sigmoid colon which did not appear  diseased in any way.  I took a few adhesions down, opened the incision a  little bit and reduced the sigmoid colon back into the abdominal cavity.  There was no bleeding.  I closed the muscle layers of the  internal oblique  and transversus abdominis with interrupted sutures of #1 Novofil.  This was  done very carefully to avoid any injury to the intra-abdominal contents.  The wound was copiously irrigated with saline.  I chose to reinforce the  hernia repair with a 6 inch x 6 inch sheet of polypropylene mesh.  This had  to be cut down to size a bit.  This was sutured on top of the internal  oblique with interrupted mattress sutures of 0 Prolene.  Medially, I took  the coverage over on top of the anterior rectus sheath a little bit.  The  wound was then irrigated with saline.  I placed a 50 [place] Blake drain in  the space between the internal oblique and the external oblique and brought  that out through the abdominal wall laterally, sutured that to the skin and  connected it to a suction bulb.  The external oblique was then closed over  the mesh with a running suture of #1 PDS.  This coverage was good.  The  wound was irrigated with saline.  The subcutaneous tissue was closed with  interrupted sutures of 2-0 Vicryl and the  skin closed with skin staples.  Skin bandages were placed and the patient  taken to the recovery room in stable condition.  Estimated blood loss was  about 75-100 cc.  There were no  complications.  Sponge, needle and  instrument counts were correct.                                               Angelia Mould. Derrell Lolling, M.D.    HMI/MEDQ  D:  08/28/2003  T:  08/28/2003  Job:  [id number]   cc:   Lynder Parents, M.D.  [place], [place]   Orpah Clinton, M.D.

## 2011-02-18 LAB — COMPREHENSIVE METABOLIC PANEL
ALT: 30 U/L (ref 0–53)
AST: 28 U/L (ref 0–37)
Albumin: 3.5 g/dL (ref 3.5–5.2)
Alkaline Phosphatase: 53 U/L (ref 39–117)
BUN: 15 mg/dL (ref 6–23)
CO2: 26 mEq/L (ref 19–32)
Calcium: 9 mg/dL (ref 8.4–10.5)
Chloride: 104 mEq/L (ref 96–112)
Creatinine, Ser: 0.9 mg/dL (ref 0.4–1.5)
GFR calc Af Amer: >60 mL/min (ref >60)
GFR calc non Af Amer: >60 mL/min (ref >60)
Glucose, Bld: 88 mg/dL (ref 70–99)
Potassium: 4.2 mEq/L (ref 3.5–5.1)
Sodium: 138 mEq/L (ref 135–145)
Total Bilirubin: 0.8 mg/dL (ref 0.3–1.2)
Total Protein: 6.9 g/dL (ref 6.0–8.3)

## 2011-02-18 LAB — URINALYSIS, ROUTINE W REFLEX MICROSCOPIC
Bilirubin Urine: NEGATIVE
Glucose, UA: NEGATIVE mg/dL
Hgb urine dipstick: NEGATIVE
Ketones, ur: NEGATIVE mg/dL
Nitrite: NEGATIVE
Protein, ur: NEGATIVE mg/dL
Specific Gravity, Urine: 1.02 (ref 1.005–1.030)
Urobilinogen, UA: 1 mg/dL (ref 0.0–1.0)
pH: 6 (ref 5.0–8.0)

## 2011-02-18 LAB — URINE MICROSCOPIC-ADD ON

## 2011-02-18 LAB — CBC
HCT: 46.7 % (ref 39.0–52.0)
Hemoglobin: 15.8 g/dL (ref 13.0–17.0)
MCHC: 33.9 g/dL (ref 30.0–36.0)
MCV: 105.4 fL — ABNORMAL HIGH (ref 78.0–100.0)
Platelets: 220 10*3/uL (ref 150–400)
RBC: 4.43 MIL/uL (ref 4.22–5.81)
RDW: 13.6 % (ref 11.5–15.5)
WBC: 5.3 10*3/uL (ref 4.0–10.5)

## 2011-02-18 LAB — DIFFERENTIAL
Eosinophils Absolute: 0.1 10*3/uL (ref 0.0–0.7)
Lymphs Abs: 1.8 10*3/uL (ref 0.7–4.0)
Neutro Abs: 2.9 10*3/uL (ref 1.7–7.7)
Neutrophils Relative %: 55 % (ref 43–77)

## 2013-01-26 ENCOUNTER — Encounter: Payer: Self-pay | Admitting: Neurology

## 2013-01-26 DIAGNOSIS — M545 Low back pain, unspecified: Secondary | ICD-10-CM | POA: Insufficient documentation

## 2013-01-28 ENCOUNTER — Encounter: Payer: Self-pay | Admitting: Neurology

## 2013-01-29 ENCOUNTER — Ambulatory Visit (INDEPENDENT_AMBULATORY_CARE_PROVIDER_SITE_OTHER): Payer: 59 | Admitting: Neurology

## 2013-01-29 ENCOUNTER — Encounter: Payer: Self-pay | Admitting: Neurology

## 2013-01-29 VITALS — BP 126/83 | HR 75 | Ht 64.0 in | Wt 263.0 lb

## 2013-01-29 DIAGNOSIS — R202 Paresthesia of skin: Secondary | ICD-10-CM

## 2013-01-29 DIAGNOSIS — R209 Unspecified disturbances of skin sensation: Secondary | ICD-10-CM

## 2013-01-29 NOTE — Progress Notes (Signed)
[facility]  PATIENT: Austin Estrada DOB: 06/15/1945  HISTORICAL  Austin Estrada is a 67 year old male, referred by his primary care physician for evaluation of numbness on his feet  He had a past medical history of chronic low back pain, obesity, but denied a previous history of diabetes,  Since 2013, he began to notice numbness at his feet, starting at the plantar surface, bilateral toes, there was no burning pain, no weakness, but these feet is very sensitive, he could no longer walk bare feet, it send shooting pain from his feet to his legs.  He also complains of chronic midline low back pain, there was no shooting pain to his lower extremity, he denies gait difficulty, no weakness, no bowel bladder incontinence, he denies finger is paresthesia  REVIEW OF SYSTEMS: Full 14 system review of systems performed and notable only for obesity, low back pain when bending over.  ALLERGIES: No Known Allergies  HOME MEDICATIONS: Outpatient Prescriptions Prior to Visit  Medication Sig Dispense Refill  . ibuprofen (ADVIL,MOTRIN) 200 MG tablet Take 200 mg by mouth every 6 (six) hours as needed for pain.      . Loratadine (CLARITIN) 10 MG CAPS Take by mouth daily.        PAST MEDICAL HISTORY: Past Medical History  Diagnosis Date  . Low back pain   . Obese     PAST SURGICAL HISTORY: Past Surgical History  Procedure Laterality Date  . Hernia repair 2009    . Rotator cuff repair Left     FAMILY HISTORY: Family History  Problem Relation Age of Onset  . Liver cancer Father     SOCIAL HISTORY:  History   Social History  . Marital Status: Married    Spouse Name: Austin Estrada     Number of Children: 2  . Years of Education: college   Occupational History  . self employed    Social History Main Topics  . Smoking status: Never Smoker   . Smokeless tobacco: Never Used  . Alcohol Use: No  . Drug Use: No  . Sexual Activity: Not on file    Social History Narrative     Patient is self employed. College education. BSBA, accountant, install hard wood floor.   Patient lives at home with wife Austin Estrada.     PHYSICAL EXAM    Filed Vitals:   01/29/13 1003  BP: 126/83  Pulse: 75  Height: 5\' 4"  (1.626 m)  Weight: 263 lb (119.296 kg)    Body mass index is 45.12 kg/(m^2).   Generalized: In no acute distress  Neck: Supple, no carotid bruits   Cardiac: Regular rate rhythm  Pulmonary: Clear to auscultation bilaterally  Musculoskeletal: No deformity  Neurological examination  Mentation: Alert oriented to time, place, history taking, and causual conversation  Cranial nerve II-XII: Pupils were equal round reactive to light extraocular movements were full, visual field were full on confrontational test. facial sensation and strength were normal. hearing was intact to finger rubbing bilaterally. Uvula tongue midline.  head turning and shoulder shrug and were normal and symmetric.Tongue protrusion into cheek strength was normal.  Motor: normal tone, bulk and strength.  Sensory: mildly length dependent decreased fine touch, pinprick to ankle level, preserved vibratory sensation, and proprioception at toes.  Coordination: Normal finger to nose, heel-to-shin bilaterally there was no truncal ataxia  Gait: Rising up from seated position without assistance, normal stance, without trunk ataxia, moderate stride, good arm swing, smooth turning, able to perform tiptoe, and  heel walking without difficulty.   Romberg signs: Negative  Deep tendon reflexes: Brachioradialis 2/2, biceps 2/2, triceps 2/2, patellar 2/2, Achilles trace, plantar responses were flexor bilaterally.   DIAGNOSTIC DATA (LABS, IMAGING, TESTING) - I reviewed patient records, labs, notes, testing and imaging myself where available.  Lab Results  Component Value Date   WBC 6.0 06/24/2008   HGB 15.8 06/24/2008   HCT 46.0 06/24/2008   MCV 102.6* 06/24/2008   PLT 264 06/24/2008      Component Value  Date/Time   NA 135 06/24/2008 1200   K 4.1 06/24/2008 1200   CL 102 06/24/2008 1200   CO2 27 06/24/2008 1200   GLUCOSE 130* 06/24/2008 1200   BUN 13 06/24/2008 1200   CREATININE 0.82 06/24/2008 1200   CALCIUM 9.1 06/24/2008 1200   PROT 6.9 04/23/2008 1410   ALBUMIN 3.5 04/23/2008 1410   AST 28 04/23/2008 1410   ALT 30 04/23/2008 1410   ALKPHOS 53 04/23/2008 1410   BILITOT 0.8 04/23/2008 1410   GFRNONAA >60 06/24/2008 1200   GFRAA  Value: >60        The eGFR has been calculated using the MDRD equation. This calculation has not been validated in all clinical situations. eGFR's persistently <60 mL/min signify possible Chronic Kidney Disease. 06/24/2008 1200    ASSESSMENT AND PLAN   67 years old Caucasian male, presenting with bilateral feet paresthesia since 2013, suggestive of small fiber neuropathy,  1 EMG nerve conduction study 2. Laboratory evaluation to rule out treatable cause      Levert Feinstein, M.D. Ph.D.  [facility] [address], [address] [place], [place] [address] [phone]

## 2013-01-30 LAB — ANA: Anti Nuclear Antibody(ANA): NEGATIVE

## 2013-01-30 LAB — HEMOGLOBIN A1C: Est. average glucose Bld gHb Est-mCnc: 114 mg/dL

## 2013-01-30 LAB — COMPREHENSIVE METABOLIC PANEL
ALT: 22 IU/L (ref 0–44)
AST: 26 IU/L (ref 0–40)
CO2: 23 mmol/L (ref 18–29)
Calcium: 9.2 mg/dL (ref 8.6–10.2)
Creatinine, Ser: 0.99 mg/dL (ref 0.76–1.27)
Globulin, Total: 3.3 g/dL (ref 1.5–4.5)
Glucose: 97 mg/dL (ref 65–99)
Potassium: 4.3 mmol/L (ref 3.5–5.2)
Sodium: 142 mmol/L (ref 134–144)

## 2013-01-30 LAB — VITAMIN B12: Vitamin B-12: 287 pg/mL (ref 211–946)

## 2013-01-30 LAB — RPR: RPR: NONREACTIVE

## 2013-01-30 LAB — THYROID PANEL WITH TSH: TSH: 2.48 u[IU]/mL (ref 0.450–4.500)

## 2013-02-08 ENCOUNTER — Ambulatory Visit (INDEPENDENT_AMBULATORY_CARE_PROVIDER_SITE_OTHER): Payer: 59 | Admitting: Neurology

## 2013-02-08 ENCOUNTER — Encounter (INDEPENDENT_AMBULATORY_CARE_PROVIDER_SITE_OTHER): Payer: Self-pay

## 2013-02-08 DIAGNOSIS — G63 Polyneuropathy in diseases classified elsewhere: Secondary | ICD-10-CM

## 2013-02-08 DIAGNOSIS — G609 Hereditary and idiopathic neuropathy, unspecified: Secondary | ICD-10-CM

## 2013-02-08 DIAGNOSIS — R202 Paresthesia of skin: Secondary | ICD-10-CM

## 2013-02-08 NOTE — Procedures (Signed)
    [facility]  NCS (NERVE CONDUCTION STUDY) WITH EMG (ELECTROMYOGRAPHY) REPORT   STUDY DATE: 02/08/2013 PATIENT NAME: Austin Estrada DOB: [birth year] MRN: [id number]    TECHNOLOGIST: Gearldine Shown ELECTROMYOGRAPHER: Levert Feinstein M.D.  CLINICAL INFORMATION:  67 years old right-handed Caucasian male, with past medical history of obesity, presenting with gradual onset bilateral feet paresthesia for one year  Physical examination: Bilateral lower extremity motor strength is normal, including bilateral distal leg strength. deep tendon reflexes showed absent ankle reflex, mild length dependent sensory changes.  FINDINGS: NERVE CONDUCTION STUDY:  Bilateral peroneal sensory responses were absent. Bilateral peroneal to EDB, and tibial motor response showed severely decreased CMAP amplitude. Bilateral tibial H. reflexes were absent.  Right median, ulnar sensory and motor responses were normal.    NEEDLE ELECTROMYOGRAPHY: Selected needle examination was performed at right lower extremity muscles, and right lumbosacral paraspinal muscles.  Needle examination of right tibialis anterior, tibialis posterior, medial gastrocnemius, peroneal longus, vastus lateralis, biceps femoris long head, was normal.  There was no spontaneous activity at right lumbosacral paraspinal muscles, right L4, L5, S1.  IMPRESSION:  This is a mild abnormal study. 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 There is no evidence of right lumbosacral radiculopathy.   INTERPRETING PHYSICIAN:   Levert Feinstein M.D. Ph.D. [facility] [address], [address] [place], [place] [address] [phone]

## 2014-12-25 DIAGNOSIS — L570 Actinic keratosis: Secondary | ICD-10-CM | POA: Diagnosis not present

## 2014-12-25 DIAGNOSIS — L820 Inflamed seborrheic keratosis: Secondary | ICD-10-CM | POA: Diagnosis not present

## 2014-12-25 DIAGNOSIS — L218 Other seborrheic dermatitis: Secondary | ICD-10-CM | POA: Diagnosis not present

## 2015-02-04 DIAGNOSIS — E780 Pure hypercholesterolemia: Secondary | ICD-10-CM | POA: Diagnosis not present

## 2015-02-04 DIAGNOSIS — Z23 Encounter for immunization: Secondary | ICD-10-CM | POA: Diagnosis not present

## 2015-02-04 DIAGNOSIS — Z125 Encounter for screening for malignant neoplasm of prostate: Secondary | ICD-10-CM | POA: Diagnosis not present

## 2015-02-04 DIAGNOSIS — Z6841 Body Mass Index (BMI) 40.0 and over, adult: Secondary | ICD-10-CM | POA: Diagnosis not present

## 2015-02-04 DIAGNOSIS — Z0001 Encounter for general adult medical examination with abnormal findings: Secondary | ICD-10-CM | POA: Diagnosis not present

## 2015-02-04 DIAGNOSIS — M25569 Pain in unspecified knee: Secondary | ICD-10-CM | POA: Diagnosis not present

## 2015-02-04 DIAGNOSIS — Z131 Encounter for screening for diabetes mellitus: Secondary | ICD-10-CM | POA: Diagnosis not present

## 2015-02-11 DIAGNOSIS — M25562 Pain in left knee: Secondary | ICD-10-CM | POA: Diagnosis not present

## 2015-06-10 DIAGNOSIS — J329 Chronic sinusitis, unspecified: Secondary | ICD-10-CM | POA: Diagnosis not present

## 2015-06-19 DIAGNOSIS — H40231 Intermittent angle-closure glaucoma, right eye: Secondary | ICD-10-CM | POA: Diagnosis not present

## 2015-06-19 DIAGNOSIS — H578 Other specified disorders of eye and adnexa: Secondary | ICD-10-CM | POA: Diagnosis not present

## 2015-06-19 DIAGNOSIS — H5711 Ocular pain, right eye: Secondary | ICD-10-CM | POA: Diagnosis not present

## 2015-06-22 DIAGNOSIS — H40231 Intermittent angle-closure glaucoma, right eye: Secondary | ICD-10-CM | POA: Diagnosis not present

## 2015-06-22 DIAGNOSIS — H15112 Episcleritis periodica fugax, left eye: Secondary | ICD-10-CM | POA: Diagnosis not present

## 2015-06-30 DIAGNOSIS — H40231 Intermittent angle-closure glaucoma, right eye: Secondary | ICD-10-CM | POA: Diagnosis not present

## 2015-07-14 DIAGNOSIS — H40231 Intermittent angle-closure glaucoma, right eye: Secondary | ICD-10-CM | POA: Diagnosis not present

## 2015-07-24 DIAGNOSIS — W19XXXA Unspecified fall, initial encounter: Secondary | ICD-10-CM | POA: Diagnosis not present

## 2015-07-24 DIAGNOSIS — M545 Low back pain: Secondary | ICD-10-CM | POA: Diagnosis not present

## 2015-07-30 DIAGNOSIS — S335XXA Sprain of ligaments of lumbar spine, initial encounter: Secondary | ICD-10-CM | POA: Diagnosis not present

## 2015-07-30 DIAGNOSIS — M5442 Lumbago with sciatica, left side: Secondary | ICD-10-CM | POA: Diagnosis not present

## 2015-08-06 DIAGNOSIS — M545 Low back pain: Secondary | ICD-10-CM | POA: Diagnosis not present

## 2015-08-10 DIAGNOSIS — M545 Low back pain: Secondary | ICD-10-CM | POA: Diagnosis not present

## 2015-08-14 DIAGNOSIS — M5442 Lumbago with sciatica, left side: Secondary | ICD-10-CM | POA: Diagnosis not present

## 2015-08-14 DIAGNOSIS — S335XXD Sprain of ligaments of lumbar spine, subsequent encounter: Secondary | ICD-10-CM | POA: Diagnosis not present

## 2015-08-18 ENCOUNTER — Ambulatory Visit: Payer: Self-pay | Admitting: Physician Assistant

## 2015-09-08 NOTE — Pre-Procedure Instructions (Signed)
Austin Estrada  09/08/2015      [facility] - [place] ([place]), [place] [id number] [place] ([place]) [place] [address] Phone: [phone] Fax: [phone]    Your procedure is scheduled on May 4  Report to [facility] at 530 A.M.  Call this number if you have problems the morning of surgery:  [phone]   Remember:  Do not eat food or drink liquids after midnight.  Take these medicines the morning of surgery with A SIP OF WATER Hydrocodone-acetaminophen (Norco) if needed, Loratadine (Clartin), timolol (Betimol) eye drops  Stop taking Aspirin, BC's, Goody's, Herbal medications, Fish Oil, Ibuprofen, Advil, Motrin, Aleve   Do not wear jewelry, make-up or nail polish.  Do not wear lotions, powders, or perfumes.  You may wear deodorant.  Do not shave 48 hours prior to surgery.  Men may shave face and neck.  Do not bring valuables to the hospital.  [facility] is not responsible for any belongings or valuables.  Contacts, dentures or bridgework may not be worn into surgery.  Leave your suitcase in the car.  After surgery it may be brought to your room.  For patients admitted to the hospital, discharge time will be determined by your treatment team.  Patients discharged the day of surgery will not be allowed to drive home.   Special instructions:  [facility] - Preparing for Surgery  Before surgery, you can play an important role.  Because skin is not sterile, your skin needs to be as free of germs as possible.  You can reduce the number of germs on you skin by washing with CHG (chlorahexidine gluconate) soap before surgery.  CHG is an antiseptic cleaner which kills germs and bonds with the skin to continue killing germs even after washing.  Please DO NOT use if you have an allergy to CHG or antibacterial soaps.  If your skin becomes reddened/irritated stop using the CHG and inform your nurse when you arrive at Short Stay.  Do not  shave (including legs and underarms) for at least 48 hours prior to the first CHG shower.  You may shave your face.  Please follow these instructions carefully:   1.  Shower with CHG Soap the night before surgery and the   morning of Surgery.  2.  If you choose to wash your hair, wash your hair first as usual with your  normal shampoo.  3.  After you shampoo, rinse your hair and body thoroughly to remove the Shampoo.  4.  Use CHG as you would any other liquid soap.  You can apply chg directly to the skin and wash gently with scrungie or a clean washcloth.  5.  Apply the CHG Soap to your body ONLY FROM THE NECK DOWN.  Do not use on open wounds or open sores.  Avoid contact with your eyes,  ears, mouth and genitals (private parts).  Wash genitals (private parts) with your normal soap.  6.  Wash thoroughly, paying special attention to the area where your surgery will be performed.  7.  Thoroughly rinse your body with warm water from the neck down.  8.  DO NOT shower/wash with your normal soap after using and rinsing off the CHG Soap.  9.  Pat yourself dry with a clean towel.            10.  Wear clean pajamas.  11.  Place clean sheets on your bed the night of your first shower and do not   sleep with pets.  Day of Surgery  Do not apply any lotions/deoderants the morning of surgery.  Please wear clean clothes to the hospital/surgery center.     Please read over the following fact sheets that you were given. Pain Booklet, Coughing and Deep Breathing, Blood Transfusion Information, MRSA Information and Surgical Site Infection Prevention

## 2015-09-09 ENCOUNTER — Encounter (HOSPITAL_COMMUNITY)
Admission: RE | Admit: 2015-09-09 | Discharge: 2015-09-09 | Disposition: A | Discharge status: Home / Self Care | Payer: Commercial Managed Care - HMO | Source: Ambulatory Visit | Attending: Orthopedic Surgery | Admitting: Orthopedic Surgery

## 2015-09-09 ENCOUNTER — Encounter (HOSPITAL_COMMUNITY): Payer: Self-pay

## 2015-09-09 ENCOUNTER — Other Ambulatory Visit (HOSPITAL_COMMUNITY): Payer: Self-pay | Admitting: *Deleted

## 2015-09-09 DIAGNOSIS — M545 Low back pain: Secondary | ICD-10-CM | POA: Diagnosis not present

## 2015-09-09 DIAGNOSIS — Z01812 Encounter for preprocedural laboratory examination: Secondary | ICD-10-CM | POA: Insufficient documentation

## 2015-09-09 DIAGNOSIS — Z01818 Encounter for other preprocedural examination: Secondary | ICD-10-CM | POA: Insufficient documentation

## 2015-09-09 DIAGNOSIS — Z0183 Encounter for blood typing: Secondary | ICD-10-CM | POA: Insufficient documentation

## 2015-09-09 DIAGNOSIS — M5136 Other intervertebral disc degeneration, lumbar region: Secondary | ICD-10-CM | POA: Diagnosis not present

## 2015-09-09 HISTORY — DX: Other complications of anesthesia, initial encounter: T88.59XA

## 2015-09-09 HISTORY — DX: Reserved for inherently not codable concepts without codable children: IMO0001

## 2015-09-09 HISTORY — DX: Unspecified osteoarthritis, unspecified site: M19.90

## 2015-09-09 HISTORY — DX: Adverse effect of unspecified anesthetic, initial encounter: T41.45XA

## 2015-09-09 LAB — CBC
HCT: 45.4 % (ref 39.0–52.0)
Hemoglobin: 15.4 g/dL (ref 13.0–17.0)
MCH: 34.2 pg — ABNORMAL HIGH (ref 26.0–34.0)
MCHC: 33.9 g/dL (ref 30.0–36.0)
MCV: 100.9 fL — AB (ref 78.0–100.0)
PLATELETS: 220 10*3/uL (ref 150–400)
RBC: 4.5 MIL/uL (ref 4.22–5.81)
RDW: 12.8 % (ref 11.5–15.5)
WBC: 9.2 10*3/uL (ref 4.0–10.5)

## 2015-09-09 LAB — BASIC METABOLIC PANEL
Anion gap: 10 (ref 5–15)
BUN: 14 mg/dL (ref 6–20)
CHLORIDE: 103 mmol/L (ref 101–111)
CO2: 24 mmol/L (ref 22–32)
CREATININE: 1.05 mg/dL (ref 0.61–1.24)
Calcium: 8.9 mg/dL (ref 8.9–10.3)
Glucose, Bld: 131 mg/dL — ABNORMAL HIGH (ref 65–99)
POTASSIUM: 4.3 mmol/L (ref 3.5–5.1)
SODIUM: 137 mmol/L (ref 135–145)

## 2015-09-09 LAB — SURGICAL PCR SCREEN
MRSA, PCR: NEGATIVE
STAPHYLOCOCCUS AUREUS: NEGATIVE

## 2015-09-09 LAB — ABO/RH: ABO/RH(D): O NEG

## 2015-09-09 LAB — TYPE AND SCREEN
ABO/RH(D): O NEG
ANTIBODY SCREEN: NEGATIVE

## 2015-09-09 NOTE — Progress Notes (Signed)
Pt denies any cardiac history or cardiologist.  States had a routine stress test more than 25 years ago which was normal.  PCP Dr Harle Battiest with [facility]  Denies any respiratory problem.

## 2015-09-10 ENCOUNTER — Encounter (HOSPITAL_COMMUNITY): Payer: Self-pay | Admitting: Emergency Medicine

## 2015-09-10 NOTE — Progress Notes (Signed)
Anesthesia Chart Review:  Pt is a 70 year old male scheduled for L3-S1 decompression and fusion on 09/17/2015 with Dr. Rolena Infante.   PMH includes:  Low back pain. Never smoker. BMI 47  Medications include: timolol ophthalmic  Preoperative labs reviewed.    Pt's history does not require pre-op CXR or EKG.   If no changes, I anticipate pt can proceed with surgery as scheduled.   Willeen Cass, FNP-BC [facility] Short Stay Surgical Center/Anesthesiology Phone: [phone] 09/10/2015 3:42 PM

## 2015-09-14 DIAGNOSIS — M5442 Lumbago with sciatica, left side: Secondary | ICD-10-CM | POA: Diagnosis not present

## 2015-09-17 ENCOUNTER — Encounter (HOSPITAL_COMMUNITY): Admission: RE | Payer: Self-pay | Source: Ambulatory Visit

## 2015-09-17 ENCOUNTER — Ambulatory Visit (HOSPITAL_COMMUNITY)
Admission: RE | Admit: 2015-09-17 | Payer: Commercial Managed Care - HMO | Source: Ambulatory Visit | Admitting: Orthopedic Surgery

## 2015-09-17 SURGERY — LUMBAR LAMINECTOMY/DECOMPRESSION MICRODISCECTOMY 3 LEVELS
Anesthesia: General

## 2015-09-23 DIAGNOSIS — G8929 Other chronic pain: Secondary | ICD-10-CM | POA: Diagnosis not present

## 2015-09-23 DIAGNOSIS — M5442 Lumbago with sciatica, left side: Secondary | ICD-10-CM | POA: Diagnosis not present

## 2015-09-29 DIAGNOSIS — M5442 Lumbago with sciatica, left side: Secondary | ICD-10-CM | POA: Diagnosis not present

## 2015-09-29 DIAGNOSIS — G8929 Other chronic pain: Secondary | ICD-10-CM | POA: Diagnosis not present

## 2015-10-01 DIAGNOSIS — G8929 Other chronic pain: Secondary | ICD-10-CM | POA: Diagnosis not present

## 2015-10-01 DIAGNOSIS — M5442 Lumbago with sciatica, left side: Secondary | ICD-10-CM | POA: Diagnosis not present

## 2015-10-02 DIAGNOSIS — M5136 Other intervertebral disc degeneration, lumbar region: Secondary | ICD-10-CM | POA: Diagnosis not present

## 2015-10-02 DIAGNOSIS — M4806 Spinal stenosis, lumbar region: Secondary | ICD-10-CM | POA: Diagnosis not present

## 2015-10-02 DIAGNOSIS — S335XXD Sprain of ligaments of lumbar spine, subsequent encounter: Secondary | ICD-10-CM | POA: Diagnosis not present

## 2015-10-06 DIAGNOSIS — M5442 Lumbago with sciatica, left side: Secondary | ICD-10-CM | POA: Diagnosis not present

## 2015-10-06 DIAGNOSIS — G8929 Other chronic pain: Secondary | ICD-10-CM | POA: Diagnosis not present

## 2015-10-07 DIAGNOSIS — M5136 Other intervertebral disc degeneration, lumbar region: Secondary | ICD-10-CM | POA: Diagnosis not present

## 2015-10-08 DIAGNOSIS — M5442 Lumbago with sciatica, left side: Secondary | ICD-10-CM | POA: Diagnosis not present

## 2015-10-08 DIAGNOSIS — G8929 Other chronic pain: Secondary | ICD-10-CM | POA: Diagnosis not present

## 2015-10-13 DIAGNOSIS — G8929 Other chronic pain: Secondary | ICD-10-CM | POA: Diagnosis not present

## 2015-10-13 DIAGNOSIS — M5442 Lumbago with sciatica, left side: Secondary | ICD-10-CM | POA: Diagnosis not present

## 2015-10-15 DIAGNOSIS — M5442 Lumbago with sciatica, left side: Secondary | ICD-10-CM | POA: Diagnosis not present

## 2015-10-15 DIAGNOSIS — G8929 Other chronic pain: Secondary | ICD-10-CM | POA: Diagnosis not present

## 2015-10-20 DIAGNOSIS — M5442 Lumbago with sciatica, left side: Secondary | ICD-10-CM | POA: Diagnosis not present

## 2015-10-20 DIAGNOSIS — G8929 Other chronic pain: Secondary | ICD-10-CM | POA: Diagnosis not present

## 2015-10-22 DIAGNOSIS — M5442 Lumbago with sciatica, left side: Secondary | ICD-10-CM | POA: Diagnosis not present

## 2015-10-22 DIAGNOSIS — G8929 Other chronic pain: Secondary | ICD-10-CM | POA: Diagnosis not present

## 2015-10-23 DIAGNOSIS — M4316 Spondylolisthesis, lumbar region: Secondary | ICD-10-CM | POA: Diagnosis not present

## 2015-10-23 DIAGNOSIS — M4806 Spinal stenosis, lumbar region: Secondary | ICD-10-CM | POA: Diagnosis not present

## 2015-10-23 DIAGNOSIS — M5442 Lumbago with sciatica, left side: Secondary | ICD-10-CM | POA: Diagnosis not present

## 2015-10-29 DIAGNOSIS — M5442 Lumbago with sciatica, left side: Secondary | ICD-10-CM | POA: Diagnosis not present

## 2015-10-29 DIAGNOSIS — G8929 Other chronic pain: Secondary | ICD-10-CM | POA: Diagnosis not present

## 2015-11-03 DIAGNOSIS — G8929 Other chronic pain: Secondary | ICD-10-CM | POA: Diagnosis not present

## 2015-11-03 DIAGNOSIS — M5442 Lumbago with sciatica, left side: Secondary | ICD-10-CM | POA: Diagnosis not present

## 2015-11-05 DIAGNOSIS — M5442 Lumbago with sciatica, left side: Secondary | ICD-10-CM | POA: Diagnosis not present

## 2015-11-05 DIAGNOSIS — G8929 Other chronic pain: Secondary | ICD-10-CM | POA: Diagnosis not present

## 2015-11-10 DIAGNOSIS — G8929 Other chronic pain: Secondary | ICD-10-CM | POA: Diagnosis not present

## 2015-11-10 DIAGNOSIS — M5442 Lumbago with sciatica, left side: Secondary | ICD-10-CM | POA: Diagnosis not present

## 2015-11-12 DIAGNOSIS — M5442 Lumbago with sciatica, left side: Secondary | ICD-10-CM | POA: Diagnosis not present

## 2015-11-12 DIAGNOSIS — G8929 Other chronic pain: Secondary | ICD-10-CM | POA: Diagnosis not present

## 2015-11-19 DIAGNOSIS — G8929 Other chronic pain: Secondary | ICD-10-CM | POA: Diagnosis not present

## 2015-11-19 DIAGNOSIS — M5442 Lumbago with sciatica, left side: Secondary | ICD-10-CM | POA: Diagnosis not present

## 2015-11-24 DIAGNOSIS — M5442 Lumbago with sciatica, left side: Secondary | ICD-10-CM | POA: Diagnosis not present

## 2015-11-24 DIAGNOSIS — G8929 Other chronic pain: Secondary | ICD-10-CM | POA: Diagnosis not present

## 2015-11-26 DIAGNOSIS — M5442 Lumbago with sciatica, left side: Secondary | ICD-10-CM | POA: Diagnosis not present

## 2015-11-27 DIAGNOSIS — M5136 Other intervertebral disc degeneration, lumbar region: Secondary | ICD-10-CM | POA: Diagnosis not present

## 2015-11-27 DIAGNOSIS — M4806 Spinal stenosis, lumbar region: Secondary | ICD-10-CM | POA: Diagnosis not present

## 2015-11-27 DIAGNOSIS — M4316 Spondylolisthesis, lumbar region: Secondary | ICD-10-CM | POA: Diagnosis not present

## 2016-01-07 DIAGNOSIS — H40231 Intermittent angle-closure glaucoma, right eye: Secondary | ICD-10-CM | POA: Diagnosis not present

## 2016-02-24 DIAGNOSIS — Z6841 Body Mass Index (BMI) 40.0 and over, adult: Secondary | ICD-10-CM | POA: Diagnosis not present

## 2016-02-24 DIAGNOSIS — Z131 Encounter for screening for diabetes mellitus: Secondary | ICD-10-CM | POA: Diagnosis not present

## 2016-02-24 DIAGNOSIS — Z0000 Encounter for general adult medical examination without abnormal findings: Secondary | ICD-10-CM | POA: Diagnosis not present

## 2016-02-24 DIAGNOSIS — M5136 Other intervertebral disc degeneration, lumbar region: Secondary | ICD-10-CM | POA: Diagnosis not present

## 2016-02-24 DIAGNOSIS — Z125 Encounter for screening for malignant neoplasm of prostate: Secondary | ICD-10-CM | POA: Diagnosis not present

## 2016-02-24 DIAGNOSIS — Z23 Encounter for immunization: Secondary | ICD-10-CM | POA: Diagnosis not present

## 2016-07-08 DIAGNOSIS — H40231 Intermittent angle-closure glaucoma, right eye: Secondary | ICD-10-CM | POA: Diagnosis not present

## 2016-09-23 DIAGNOSIS — L218 Other seborrheic dermatitis: Secondary | ICD-10-CM | POA: Diagnosis not present

## 2016-09-23 DIAGNOSIS — L570 Actinic keratosis: Secondary | ICD-10-CM | POA: Diagnosis not present

## 2016-09-23 DIAGNOSIS — L821 Other seborrheic keratosis: Secondary | ICD-10-CM | POA: Diagnosis not present

## 2016-09-23 DIAGNOSIS — D225 Melanocytic nevi of trunk: Secondary | ICD-10-CM | POA: Diagnosis not present
